# Patient Record
Sex: Male | Born: 1980 | Hispanic: Yes | Marital: Married
Health system: Southern US, Community
[De-identification: ages and names within clinical notes are randomized; demographics above are authoritative.]

---

## 2021-05-01 ENCOUNTER — Emergency Department (HOSPITAL_BASED_OUTPATIENT_CLINIC_OR_DEPARTMENT_OTHER): Payer: Self-pay

## 2021-05-01 ENCOUNTER — Other Ambulatory Visit: Payer: Self-pay

## 2021-05-01 ENCOUNTER — Emergency Department (HOSPITAL_BASED_OUTPATIENT_CLINIC_OR_DEPARTMENT_OTHER)
Admission: EM | Admit: 2021-05-01 | Discharge: 2021-05-01 | Disposition: A | Discharge status: Home / Self Care | Payer: Self-pay | Attending: Emergency Medicine | Admitting: Emergency Medicine

## 2021-05-01 ENCOUNTER — Encounter (HOSPITAL_BASED_OUTPATIENT_CLINIC_OR_DEPARTMENT_OTHER): Payer: Self-pay

## 2021-05-01 DIAGNOSIS — N451 Epididymitis: Secondary | ICD-10-CM | POA: Insufficient documentation

## 2021-05-01 DIAGNOSIS — D72829 Elevated white blood cell count, unspecified: Secondary | ICD-10-CM | POA: Insufficient documentation

## 2021-05-01 LAB — URINALYSIS, ROUTINE W REFLEX MICROSCOPIC
Bilirubin Urine: NEGATIVE
Glucose, UA: NEGATIVE mg/dL
Hgb urine dipstick: NEGATIVE
Ketones, ur: NEGATIVE mg/dL
Nitrite: NEGATIVE
Protein, ur: NEGATIVE mg/dL
Specific Gravity, Urine: 1.03 (ref 1.005–1.030)
pH: 5.5 (ref 5.0–8.0)

## 2021-05-01 LAB — URINALYSIS, MICROSCOPIC (REFLEX)

## 2021-05-01 MED ORDER — LEVOFLOXACIN 500 MG PO TABS: 500.0000 mg | ORAL_TABLET | Freq: Every day | ORAL | 0 refills | Status: AC

## 2021-05-01 NOTE — ED Triage Notes (Signed)
Triage completed with assistance from interpreter. Pt states he has had L testicular pain and edema since last Wednesday. Pt endorses increased urinary urgency and frequency, denies discharge. States that pain is relieved with elevating his testicles.

## 2021-05-01 NOTE — Discharge Instructions (Signed)
Please take the antibiotic as prescribed.  If the gonorrhea or chlamydia test are positive you will need additional treatment.  Recommend supportive underwear.  If you develop worsening pain, swelling, fever or other new concerning symptom, come back to ER for reassessment.

## 2021-05-01 NOTE — ED Provider Notes (Signed)
[facility] EMERGENCY DEPARTMENT Provider Note   CSN: [id number] Arrival date & time: 05/01/21  6283     History Chief Complaint  Patient presents with   Testicle Pain    [facility] Frank Nash is a 40 y.o. male.  Presented to the emergency room with concern for testicle pain.  Reports that he has been having pain since approximately Wednesday.  Some increased urgency and frequency but no discharge or dysuria.  Reports that his pain seems to be improved with certain positions and improved with elevating testicles but gets worse after being up and about for a long time.  Sexually active with male only.  No known exposures to STDs.  HPI     History reviewed. No pertinent past medical history.  There are no problems to display for this patient.   History reviewed. No pertinent surgical history.     No family history on file.     Home Medications Prior to Admission medications   Medication Sig Start Date End Date Taking? Authorizing Provider  levofloxacin (LEVAQUIN) 500 MG tablet Take 1 tablet (500 mg total) by mouth daily. 05/01/21  Yes Milagros Loll, MD    Allergies    Patient has no known allergies.  Review of Systems   Review of Systems  Constitutional:  Negative for chills and fever.  HENT:  Negative for ear pain and sore throat.   Eyes:  Negative for pain and visual disturbance.  Respiratory:  Negative for cough and shortness of breath.   Cardiovascular:  Negative for chest pain and palpitations.  Gastrointestinal:  Negative for abdominal pain and vomiting.  Genitourinary:  Positive for scrotal swelling and testicular pain. Negative for dysuria and hematuria.  Musculoskeletal:  Negative for arthralgias and back pain.  Skin:  Negative for color change and rash.  Neurological:  Negative for seizures and syncope.  All other systems reviewed and are negative.  Physical Exam Updated Vital Signs BP (!) 122/91   Pulse 71   Temp 98.7 F  (37.1 C) (Oral)   Resp 12   Ht 5\' 5"  (1.651 m)   Wt 81.6 kg   SpO2 98%   BMI 29.95 kg/m   Physical Exam Vitals and nursing note reviewed.  Constitutional:      Appearance: He is well-developed.  HENT:     Head: Normocephalic and atraumatic.  Eyes:     Conjunctiva/sclera: Conjunctivae normal.  Cardiovascular:     Rate and Rhythm: Normal rate and regular rhythm.     Heart sounds: No murmur heard. Pulmonary:     Effort: Pulmonary effort is normal. No respiratory distress.     Breath sounds: Normal breath sounds.  Abdominal:     Palpations: Abdomen is soft.     Tenderness: There is no abdominal tenderness.  Genitourinary:    Comments: William RN chaperone Left testicle appears mildly swollen, some tenderness to palpation, cremasteric reflex intact bilaterally, no inguinal hernia appreciated bilaterally Musculoskeletal:     Cervical back: Neck supple.  Skin:    General: Skin is warm and dry.  Neurological:     Mental Status: He is alert.    ED Results / Procedures / Treatments   Labs (all labs ordered are listed, but only abnormal results are displayed) Labs Reviewed  URINALYSIS, ROUTINE W REFLEX MICROSCOPIC - Abnormal; Notable for the following components:      Result Value   Leukocytes,Ua SMALL (*)    All other components within normal limits  URINALYSIS, MICROSCOPIC (  REFLEX) - Abnormal; Notable for the following components:   Bacteria, UA FEW (*)    All other components within normal limits  GC/CHLAMYDIA PROBE AMP ([place]) NOT AT [facility]    EKG None  Radiology US SCROTUM W/DOPPLER  Result Date: 05/01/2021 CLINICAL DATA:  40 year old male with left testicular pain EXAM: SCROTAL ULTRASOUND DOPPLER ULTRASOUND OF THE TESTICLES TECHNIQUE: Complete ultrasound examination of the testicles, epididymis, and other scrotal structures was performed. Color and spectral Doppler ultrasound were also utilized to evaluate blood flow to the testicles. COMPARISON:  None.  FINDINGS: Right testicle Measurements: 4.6 x 2.3 x 2.5 cm. No mass or microlithiasis visualized. Left testicle Measurements: 4.0 x 2.5 x 3.0 cm. No mass or microlithiasis visualized. Right epididymis:  Normal in size and appearance. Left epididymis: The left epididymis is enlarged and edematous appearing. It is intensely hypervascular. Hydrocele:  Trace left hydrocele. Varicocele:  None visualized. Pulsed Doppler interrogation of both testes demonstrates normal low resistance arterial and venous waveforms bilaterally. IMPRESSION: 1. Enlarged, edematous and markedly hypervascular left epididymis concerning for acute epididymitis. 2. Small left sided hydrocele is likely reactive. 3. No evidence of testicular torsion at this time. Electronically Signed   By: Malachy Moan M.D.   On: 05/01/2021 09:45    Procedures Procedures   Medications Ordered in ED Medications - No data to display  ED Course  I have reviewed the triage vital signs and the nursing notes.  Pertinent labs & imaging results that were available during my care of the patient were reviewed by me and considered in my medical decision making (see chart for details).    MDM Rules/Calculators/A&P                           40 year old male presents to ER with concern for testicular pain and swelling.  On exam noted to have mild swelling of his left testicle.  Reported pain relief with elevating his testicles.  UA with small leukocytes, few bacteria.  Ultrasound concerning for epididymitis.  This fits with clinical suspicion as well based on exam and history.  Discussed with pharmacy, they recommend course of Levaquin given epididymitis in his demographic.  Discussed plan with patient, reviewed return precautions and discharged.  Utilized #interpreter throughout visit.  After the discussed management above, the patient was determined to be safe for discharge.  The patient was in agreement with this plan and all questions regarding their  care were answered.  ED return precautions were discussed and the patient will return to the ED with any significant worsening of condition.    Final Clinical Impression(s) / ED Diagnoses Final diagnoses:  Epididymitis    Rx / DC Orders ED Discharge Orders          Ordered    levofloxacin (LEVAQUIN) 500 MG tablet  Daily        05/01/21 1007             Milagros Loll, MD 05/02/21 [id number]

## 2021-05-02 LAB — GC/CHLAMYDIA PROBE AMP (~~LOC~~) NOT AT ARMC
Chlamydia: NEGATIVE
Comment: NEGATIVE
Comment: NORMAL
Neisseria Gonorrhea: NEGATIVE

## 2022-11-07 IMAGING — US US SCROTUM W/ DOPPLER COMPLETE
2 series · 13 of 25 positions shown · non-contrast
Comparison: None.

CLINICAL DATA: 40-year-old male with left testicular pain

EXAM:
SCROTAL ULTRASOUND
DOPPLER ULTRASOUND OF THE TESTICLES
TECHNIQUE: Complete ultrasound examination of the testicles, epididymis, and
other scrotal structures was performed. Color and spectral Doppler
ultrasound were also utilized to evaluate blood flow to the
testicles.

[Series 1: us scrotum w/ doppler complete · 12 of 56 slices shown (1 of 2)]
[im 1/56]
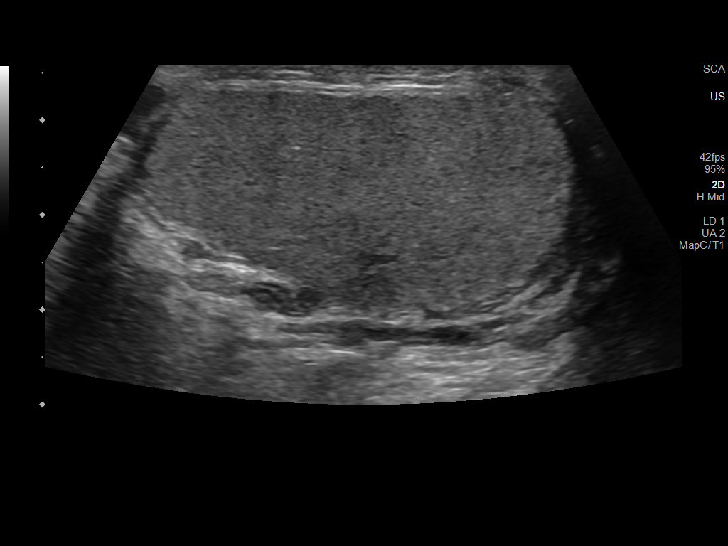
[im 5/56]
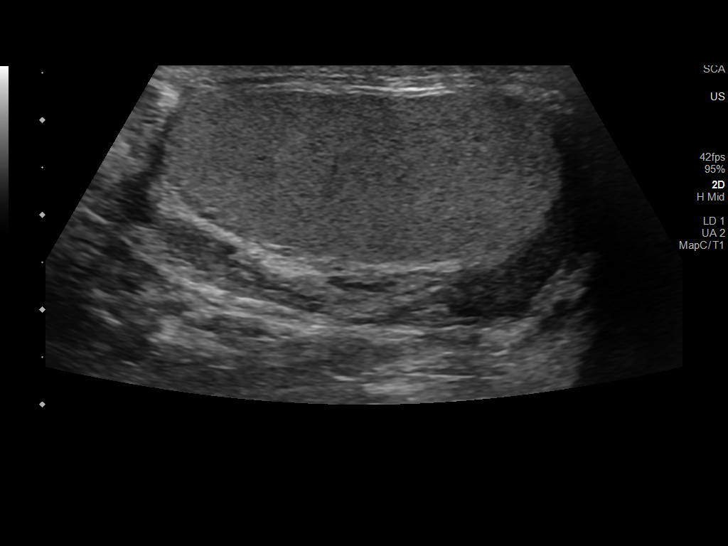
[im 10/56]
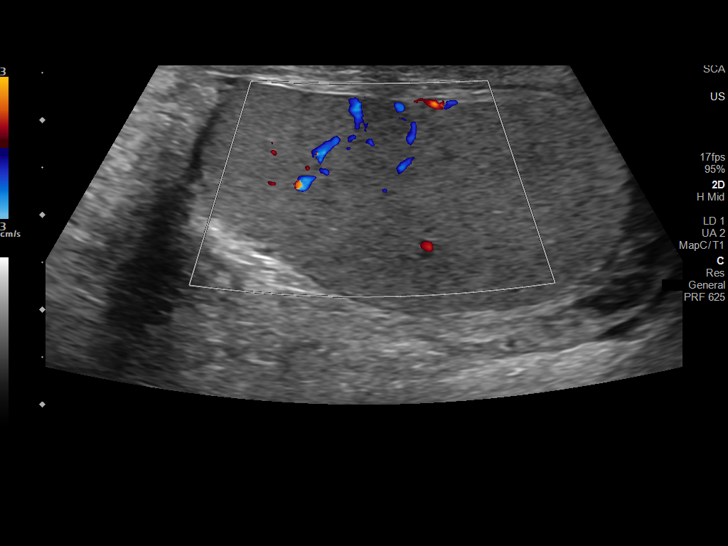
[im 15/56]
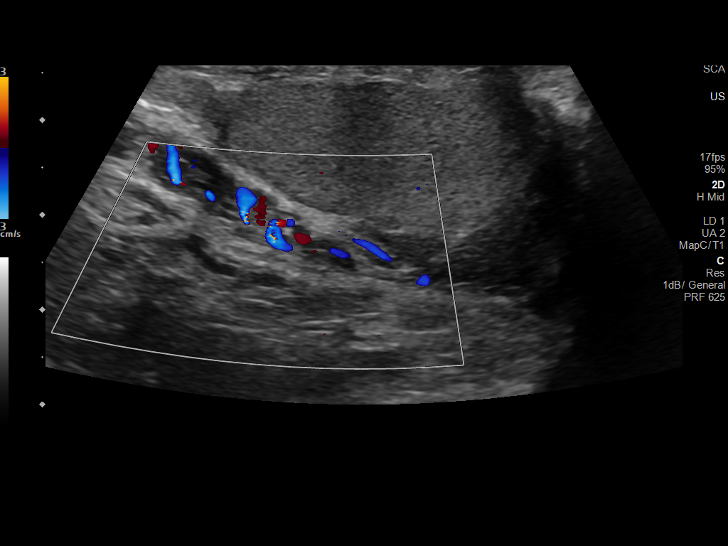
[im 20/56]
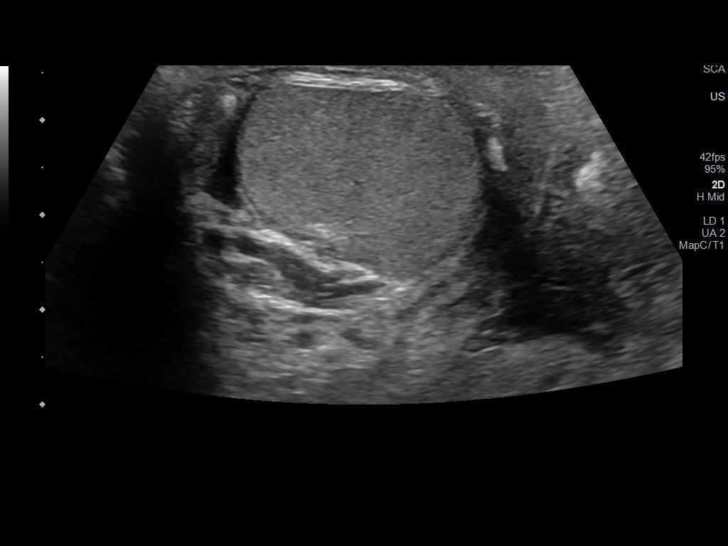
[im 24/56]
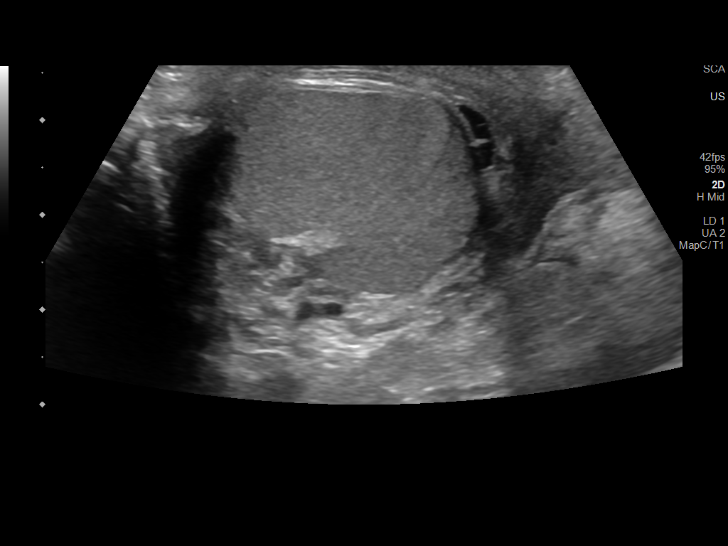
[im 29/56]
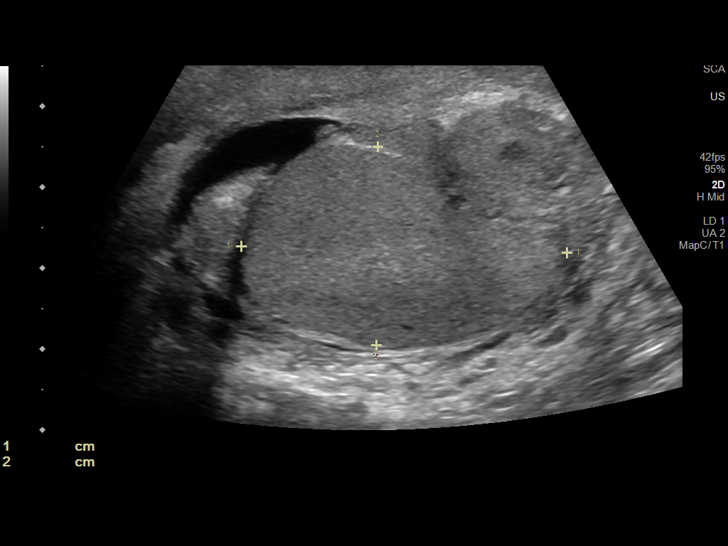
[im 34/56]
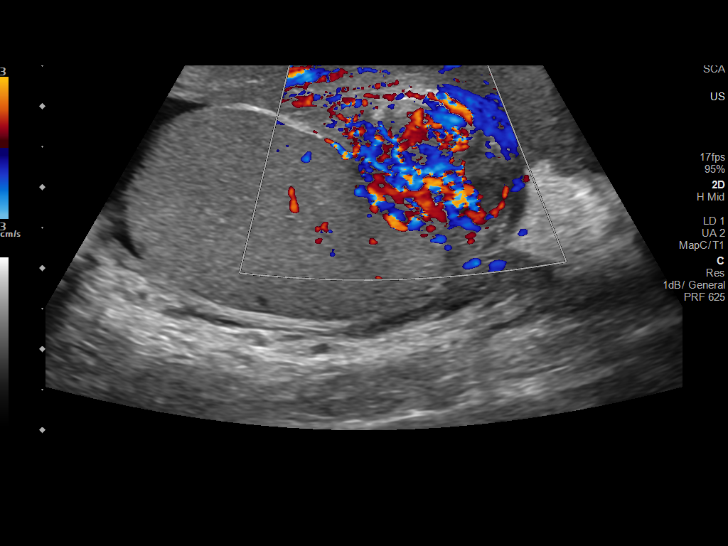
[im 39/56]
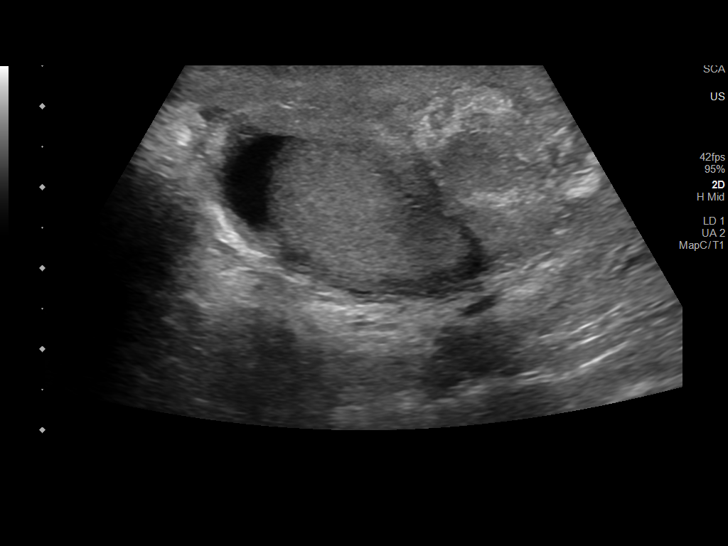
[im 44/56]
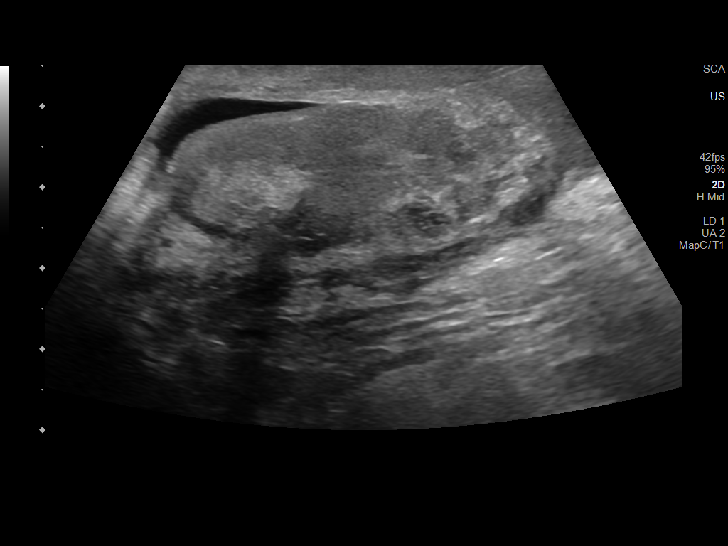
[im 48/56]
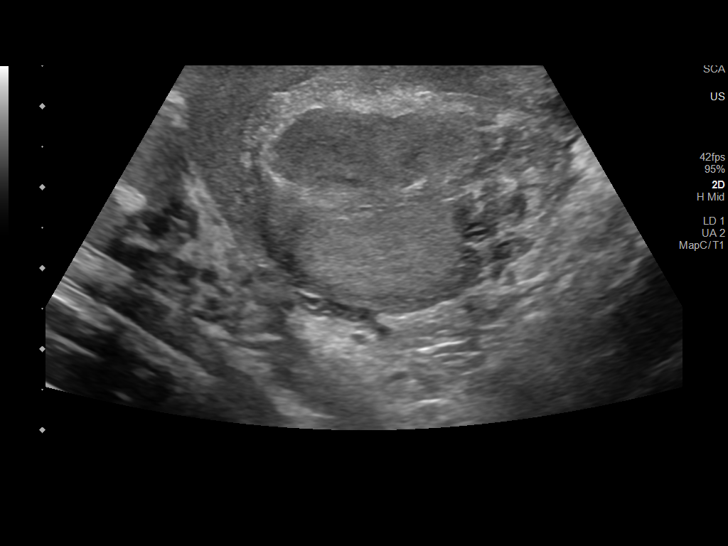
[im 53/56]
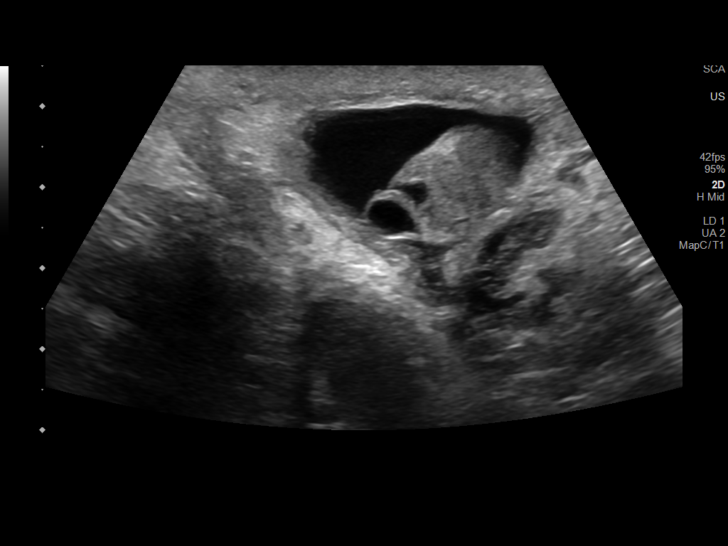

[Series 2: us scrotum w/ doppler complete · 1 of 1 slices shown (2 of 2)]
[im 1/1]
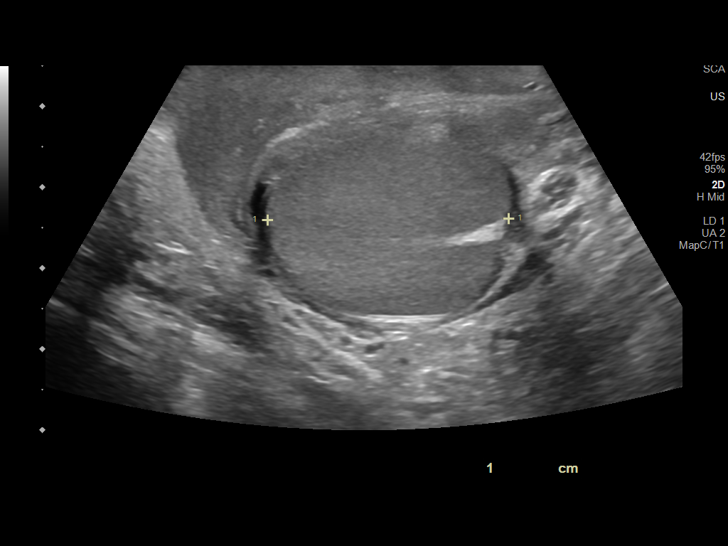

[13 of 25 positions shown; findings below may reference images not displayed]

FINDINGS: Right testicle

Measurements: 4.6 x 2.3 x 2.5 cm. No mass or microlithiasis
visualized.

Left testicle

Measurements: 4.0 x 2.5 x 3.0 cm. No mass or microlithiasis
visualized.

Right epididymis:  Normal in size and appearance.

Left epididymis: The left epididymis is enlarged and edematous
appearing. It is intensely hypervascular.

Hydrocele:  Trace left hydrocele.

Varicocele:  None visualized.

Pulsed Doppler interrogation of both testes demonstrates normal low
resistance arterial and venous waveforms bilaterally.
IMPRESSION: 1. Enlarged, edematous and markedly hypervascular left epididymis
concerning for acute epididymitis.
2. Small left sided hydrocele is likely reactive.
3. No evidence of testicular torsion at this time.
